# Patient Record
Sex: Female | Born: 1965 | Race: White | Hispanic: No | Marital: Married | Smoking: Former smoker
Health system: Southern US, Community
[De-identification: ages and names within clinical notes are randomized; demographics above are authoritative.]

## PROBLEM LIST (undated history)

## (undated) DIAGNOSIS — F329 Major depressive disorder, single episode, unspecified: Secondary | ICD-10-CM

## (undated) DIAGNOSIS — F32A Depression, unspecified: Secondary | ICD-10-CM

## (undated) DIAGNOSIS — E785 Hyperlipidemia, unspecified: Secondary | ICD-10-CM

## (undated) DIAGNOSIS — I10 Essential (primary) hypertension: Secondary | ICD-10-CM

## (undated) HISTORY — DX: Depression, unspecified: F32.A

## (undated) HISTORY — PX: VAGINAL HYSTERECTOMY: SUR661

## (undated) HISTORY — DX: Hyperlipidemia, unspecified: E78.5

## (undated) HISTORY — PX: IVC FILTER PLACEMENT (ARMC HX): HXRAD1551

## (undated) HISTORY — DX: Major depressive disorder, single episode, unspecified: F32.9

## (undated) HISTORY — DX: Essential (primary) hypertension: I10

---

## 2005-05-15 ENCOUNTER — Ambulatory Visit: Payer: Self-pay | Admitting: Internal Medicine

## 2005-05-24 ENCOUNTER — Ambulatory Visit: Payer: Self-pay | Admitting: Internal Medicine

## 2005-06-28 ENCOUNTER — Ambulatory Visit: Payer: Self-pay | Admitting: Urology

## 2005-07-12 ENCOUNTER — Ambulatory Visit: Payer: Self-pay | Admitting: Urology

## 2006-08-07 ENCOUNTER — Emergency Department: Payer: Self-pay

## 2007-01-22 ENCOUNTER — Ambulatory Visit: Payer: Self-pay | Admitting: Nurse Practitioner

## 2007-09-05 ENCOUNTER — Ambulatory Visit: Payer: Self-pay | Admitting: Internal Medicine

## 2007-11-03 ENCOUNTER — Emergency Department: Payer: Self-pay | Admitting: Emergency Medicine

## 2007-11-03 ENCOUNTER — Other Ambulatory Visit: Payer: Self-pay

## 2007-11-12 ENCOUNTER — Ambulatory Visit: Payer: Self-pay | Admitting: Family Medicine

## 2009-04-08 ENCOUNTER — Emergency Department: Payer: Self-pay | Admitting: Emergency Medicine

## 2009-08-25 ENCOUNTER — Ambulatory Visit: Payer: Self-pay | Admitting: Family Medicine

## 2009-12-07 ENCOUNTER — Ambulatory Visit: Payer: Self-pay | Admitting: Internal Medicine

## 2010-02-24 ENCOUNTER — Emergency Department: Payer: Self-pay | Admitting: Emergency Medicine

## 2010-03-02 ENCOUNTER — Ambulatory Visit: Payer: Self-pay | Admitting: Urology

## 2010-03-05 ENCOUNTER — Emergency Department: Payer: Self-pay | Admitting: Emergency Medicine

## 2010-03-09 ENCOUNTER — Ambulatory Visit: Payer: Self-pay | Admitting: Urology

## 2010-03-24 ENCOUNTER — Ambulatory Visit: Payer: Self-pay | Admitting: Urology

## 2010-04-03 ENCOUNTER — Ambulatory Visit: Payer: Self-pay | Admitting: Urology

## 2010-04-11 ENCOUNTER — Ambulatory Visit: Payer: Self-pay | Admitting: Urology

## 2012-05-07 ENCOUNTER — Ambulatory Visit: Payer: Self-pay | Admitting: Family Medicine

## 2012-05-07 LAB — URINALYSIS, COMPLETE
Bilirubin,UR: NEGATIVE
Leukocyte Esterase: NEGATIVE
Nitrite: NEGATIVE
Ph: 7.5 (ref 4.5–8.0)
Specific Gravity: 1.01 (ref 1.003–1.030)

## 2012-05-09 LAB — URINE CULTURE

## 2013-04-23 HISTORY — PX: OTHER SURGICAL HISTORY: SHX169

## 2013-05-26 ENCOUNTER — Ambulatory Visit: Payer: Self-pay | Admitting: Family Medicine

## 2013-06-04 ENCOUNTER — Ambulatory Visit: Payer: Self-pay | Admitting: Urology

## 2013-06-24 ENCOUNTER — Ambulatory Visit: Payer: Self-pay

## 2013-07-06 ENCOUNTER — Ambulatory Visit: Payer: Self-pay | Admitting: Urology

## 2013-07-06 LAB — BASIC METABOLIC PANEL
ANION GAP: 6 — AB (ref 7–16)
BUN: 13 mg/dL (ref 7–18)
Calcium, Total: 9.4 mg/dL (ref 8.5–10.1)
Chloride: 101 mmol/L (ref 98–107)
Co2: 26 mmol/L (ref 21–32)
Creatinine: 0.92 mg/dL (ref 0.60–1.30)
EGFR (African American): >60
EGFR (Non-African Amer.): >60
Glucose: 95 mg/dL (ref 65–99)
Osmolality: 266 (ref 275–301)
POTASSIUM: 3.4 mmol/L — AB (ref 3.5–5.1)
SODIUM: 133 mmol/L — AB (ref 136–145)

## 2013-07-09 ENCOUNTER — Ambulatory Visit: Payer: Self-pay | Admitting: Urology

## 2013-07-16 ENCOUNTER — Ambulatory Visit: Payer: Self-pay | Admitting: Urology

## 2013-09-29 ENCOUNTER — Ambulatory Visit: Payer: Self-pay | Admitting: Urology

## 2013-09-30 ENCOUNTER — Ambulatory Visit: Payer: Self-pay | Admitting: Urology

## 2013-09-30 LAB — POTASSIUM: Potassium: 3.5 mmol/L (ref 3.5–5.1)

## 2013-10-01 ENCOUNTER — Ambulatory Visit: Payer: Self-pay | Admitting: Urology

## 2014-01-19 ENCOUNTER — Ambulatory Visit: Payer: Self-pay | Admitting: Family Medicine

## 2014-01-19 ENCOUNTER — Emergency Department: Payer: Self-pay | Admitting: Emergency Medicine

## 2014-01-19 LAB — CBC
HCT: 45.7 % (ref 35.0–47.0)
HGB: 15.1 g/dL (ref 12.0–16.0)
MCH: 29.7 pg (ref 26.0–34.0)
MCHC: 33 g/dL (ref 32.0–36.0)
MCV: 90 fL (ref 80–100)
Platelet: 350 10*3/uL (ref 150–440)
RBC: 5.09 10*6/uL (ref 3.80–5.20)
RDW: 13.3 % (ref 11.5–14.5)
WBC: 9.2 10*3/uL (ref 3.6–11.0)

## 2014-01-19 LAB — URINALYSIS, COMPLETE
BILIRUBIN, UR: NEGATIVE
Blood: NEGATIVE
Glucose,UR: NEGATIVE
Ketone: NEGATIVE
Nitrite: NEGATIVE
PH: 6 (ref 5.0–8.0)
Protein: 30
RBC,UR: NONE SEEN /HPF (ref 0–5)
Specific Gravity: 1.02 (ref 1.000–1.030)
WBC UR: >30 /HPF (ref 0–5)

## 2014-01-19 LAB — COMPREHENSIVE METABOLIC PANEL
Albumin: 3.8 g/dL (ref 3.4–5.0)
Alkaline Phosphatase: 36 U/L — ABNORMAL LOW
Anion Gap: 7 (ref 7–16)
BILIRUBIN TOTAL: 0.6 mg/dL (ref 0.2–1.0)
BUN: 8 mg/dL (ref 7–18)
CALCIUM: 9.5 mg/dL (ref 8.5–10.1)
Chloride: 106 mmol/L (ref 98–107)
Co2: 25 mmol/L (ref 21–32)
Creatinine: 0.99 mg/dL (ref 0.60–1.30)
GLUCOSE: 113 mg/dL — AB (ref 65–99)
OSMOLALITY: 275 (ref 275–301)
Potassium: 3.4 mmol/L — ABNORMAL LOW (ref 3.5–5.1)
SGOT(AST): 26 U/L (ref 15–37)
SGPT (ALT): 41 U/L
Sodium: 138 mmol/L (ref 136–145)
Total Protein: 7.5 g/dL (ref 6.4–8.2)

## 2014-01-19 LAB — LIPASE, BLOOD: Lipase: 134 U/L (ref 73–393)

## 2014-01-19 LAB — TROPONIN I

## 2014-01-21 HISTORY — PX: CHOLECYSTECTOMY: SHX55

## 2014-01-28 ENCOUNTER — Ambulatory Visit: Payer: Self-pay | Admitting: Surgery

## 2014-02-01 LAB — PATHOLOGY REPORT

## 2014-07-15 ENCOUNTER — Ambulatory Visit: Payer: Self-pay

## 2014-07-27 ENCOUNTER — Ambulatory Visit
Admit: 2014-07-27 | Disposition: A | Discharge status: Home / Self Care | Payer: Self-pay | Attending: Urology | Admitting: Urology

## 2014-08-04 ENCOUNTER — Ambulatory Visit
Admit: 2014-08-04 | Disposition: A | Discharge status: Home / Self Care | Payer: Self-pay | Attending: Family Medicine | Admitting: Family Medicine

## 2014-08-14 NOTE — Op Note (Signed)
PATIENT NAME:  Jenny Hall, Jenny Hall MR#:  [id number] DATE OF BIRTH:  1965-10-16  DATE OF PROCEDURE:  01/28/2014  PREOPERATIVE DIAGNOSIS: Symptomatic cholelithiasis.   POSTOPERATIVE DIAGNOSIS: Symptomatic cholelithiasis.   PROCEDURE PERFORMED: Laparoscopic cholecystectomy.   ANESTHESIA: General.   ESTIMATED BLOOD LOSS: 15 mL.  COMPLICATIONS: None.   SPECIMENS:  Gallbladder.  INDICATIONS: Ms. Jenny Hall is a pleasant 49 year old female who presents with approximately 3 days of worsening right upper quadrant pain. She had an ultrasound and labs with pain characteristics consistent with symptomatic cholelithiasis. She was brought to the operating room for laparoscopic cholecystectomy.   DETAILS OF PROCEDURE: Informed consent was obtained.  Ms. Jenny Hall was brought to the operating room suite. She was induced. Endotracheal tube was placed, general anesthesia was administered. Her abdomen was prepped and draped in a standard surgical fashion. A timeout was then performed correctly identifying the patient name, operative site and procedure to be performed. A supraumbilical incision was made and was deepened down to the fascia. The fascia was incised. The peritoneum was entered. Two stay sutures were placed through the fasciotomy. A Hassan trocar was placed through the fasciotomy. The abdomen was insufflated. An 11 mm epigastric and two 5 mm right subcostal trocars were placed. The gallbladder was then lifted up over the dome of the liver. The cystic artery and cystic duct were dissected out. The critical view was obtained. The cystic duct and cystic artery were clipped 3 times each and ligated. The gallbladder was then taken off the gallbladder fossa with hook electrocautery and brought out through an Endo Catch bag. The gallbladder fossa was then examined for hemostasis and irrigated after all fluid was cleared and aspirated. Hemostasis was obtained. Decision was made to desufflate the abdomen and remove all  trocars under direct visualization. The supraumbilical fascia was closed with a figure-of-eight 0 Vicryl. All port sites were infiltrated with 1% lidocaine with epinephrine and the skin was closed with interrupted 4-0 Monocryl deep dermal sutures. Steri-Strips, Telfa gauze and Tegaderm were used to complete the dressing. The patient was then awoken, extubated and brought to the postanesthesia care unit. There were no immediate complications. Needle, sponge, and instrument count was correct at the end of the procedure.    ____________________________ Si Raiderhristopher A. Kea Callan, MD cal:hh D: 01/29/2014 19:22:54 ET T: 01/29/2014 20:18:01 ET JOB#: [id number]  cc: Cristal Deerhristopher A. Diara Chaudhari, MD, <Dictator> Jarvis NewcomerHRISTOPHER A Lenwood Balsam MD ELECTRONICALLY SIGNED 02/08/2014 7:09

## 2014-08-14 NOTE — Op Note (Signed)
PATIENT NAME:  Jenny Hall, Jenny Hall MR#:  [id number] DATE OF BIRTH:  April 30, 1965  DATE OF PROCEDURE:  10/01/2013  PREOPERATIVE DIAGNOSIS: Distal left ureteral calculus.   POSTOPERATIVE DIAGNOSIS: Distal left ureteral calculus.   PROCEDURE: Cystoscopy, left ureteroscopy, laser lithotripsy and extraction of calculus with a basket.   ANESTHESIA: General.  SPECIMEN: No stent, but calculi fragments removed.   FINDINGS: A distal left ureteral calculus with obstruction.   SURGEON: Trey Paulaichard Hart, MD   COMPLICATIONS: None.   BLOOD LOSS: 0 mL.  DESCRIPTION OF PROCEDURE: After an appropriate timeout with the patient sterilely prepped and draped,  we began the procedure. Fluoroscopy set so that we can easily see the left side of the abdomen and pelvis. An obvious calculus is seen in the distal left ureter. It had not been broken up with attempted shockwave lithotripsy when it was at the [facility]. There was a good amount of hydro behind this calculus. An 0.036 Glidewire was placed easily past the calculus through the cystoscope. Once past this, I take a 7-French telescoping ureteroscope into the ureter. The calculus seen is a rounded calculus mulberry-shaped. With a 365 micron front firing holmium laser, I disintegrate the calculus at a low power. The fragments are then removed with a 4 wire Nitinol 0 tipped basket without difficulty. They will not be sent for analysis. Then once this is done, I see that there is very little edema in the ureter. There is no perforation of the ureter by the holmium laser, so I am able to not put a stent in. The patient's bladder is emptied and 30 mL of 0.5% Marcaine are placed in the bladder. The original cystoscopy of the bladder revealed no tumors, masses or growths within the bladder and both orifices are intact. Then a B and O suppository is placed in the rectum. Bimanual exam reveals no rectal tumors, masses or growths.    ____________________________ Caralyn Guileichard D. Edwyna ShellHart,  DO rdh:aw D: 10/01/2013 12:35:05 ET T: 10/01/2013 12:45:31 ET JOB#: [id number]  cc: Caralyn Guileichard D. Edwyna ShellHart, DO, <Dictator> RICHARD D HART DO ELECTRONICALLY SIGNED 10/02/2013 15:42

## 2014-08-19 DIAGNOSIS — S52509A Unspecified fracture of the lower end of unspecified radius, initial encounter for closed fracture: Secondary | ICD-10-CM | POA: Insufficient documentation

## 2014-08-22 HISTORY — PX: WRIST FRACTURE SURGERY: SHX121

## 2015-01-27 ENCOUNTER — Encounter: Payer: Self-pay | Admitting: Family Medicine

## 2015-01-27 ENCOUNTER — Ambulatory Visit (INDEPENDENT_AMBULATORY_CARE_PROVIDER_SITE_OTHER): Payer: BLUE CROSS/BLUE SHIELD | Admitting: Family Medicine

## 2015-01-27 VITALS — BP 120/82 | HR 68 | Ht 69.0 in | Wt 202.0 lb

## 2015-01-27 DIAGNOSIS — I10 Essential (primary) hypertension: Secondary | ICD-10-CM | POA: Diagnosis not present

## 2015-01-27 DIAGNOSIS — E785 Hyperlipidemia, unspecified: Secondary | ICD-10-CM | POA: Diagnosis not present

## 2015-01-27 DIAGNOSIS — F3341 Major depressive disorder, recurrent, in partial remission: Secondary | ICD-10-CM

## 2015-01-27 MED ORDER — FENOFIBRIC ACID 135 MG PO CPDR: 1.0000 | DELAYED_RELEASE_CAPSULE | Freq: Every day | ORAL | Status: AC

## 2015-01-27 MED ORDER — LISINOPRIL 20 MG PO TABS: 20.0000 mg | ORAL_TABLET | Freq: Every day | ORAL | Status: DC

## 2015-01-27 MED ORDER — HYDROCHLOROTHIAZIDE 25 MG PO TABS: 25.0000 mg | ORAL_TABLET | Freq: Every day | ORAL | Status: AC

## 2015-01-27 MED ORDER — METOPROLOL SUCCINATE ER 100 MG PO TB24: 100.0000 mg | ORAL_TABLET | Freq: Every day | ORAL | Status: DC

## 2015-01-27 MED ORDER — VENLAFAXINE HCL ER 150 MG PO CP24: 150.0000 mg | ORAL_CAPSULE | Freq: Every day | ORAL | Status: DC

## 2015-01-27 NOTE — Progress Notes (Signed)
Name: Jenny Hall   MRN: [id number]    DOB: 1966-03-28   Date:01/27/2015       Progress Note  Subjective  Chief Complaint  Chief Complaint  Patient presents with  . Hypertension  . Hyperlipidemia  . Depression    Hypertension This is a chronic problem. The current episode started more than 1 year ago. The problem has been gradually improving since onset. The problem is controlled. Pertinent negatives include no anxiety, blurred vision, chest pain, headaches, malaise/fatigue, neck pain, orthopnea, palpitations, peripheral edema, PND, shortness of breath or sweats. There are no associated agents to hypertension. Risk factors for coronary artery disease include obesity, dyslipidemia, smoking/tobacco exposure and post-menopausal state. Past treatments include ACE inhibitors, beta blockers and diuretics. The current treatment provides moderate improvement. There are no compliance problems.  There is no history of angina, kidney disease, CAD/MI, CVA, heart failure, left ventricular hypertrophy, PVD, renovascular disease or retinopathy. There is no history of chronic renal disease.  Hyperlipidemia This is a chronic problem. The current episode started more than 1 year ago. The problem is controlled. Recent lipid tests were reviewed and are low. Exacerbating diseases include obesity. She has no history of chronic renal disease, diabetes, hypothyroidism, liver disease or nephrotic syndrome. Factors aggravating her hyperlipidemia include beta blockers. Pertinent negatives include no chest pain, focal sensory loss, focal weakness, leg pain, myalgias or shortness of breath. Current antihyperlipidemic treatment includes fibric acid derivatives. The current treatment provides mild improvement of lipids. There are no compliance problems.   Depression        This is a chronic problem.  The current episode started more than 1 year ago.   The onset quality is gradual.   The problem has been gradually improving  since onset.  Associated symptoms include no decreased concentration, no fatigue, no hopelessness, does not have insomnia, no myalgias, no headaches and no suicidal ideas.     The symptoms are aggravated by nothing.  Past treatments include SSRIs - Selective serotonin reuptake inhibitors.  Compliance with treatment is good.   Pertinent negatives include no hypothyroidism and no anxiety.   No problem-specific assessment & plan notes found for this encounter.   Past Medical History  Diagnosis Date  . Depression   . Hyperlipidemia   . Hypertension     Past Surgical History  Procedure Laterality Date  . Vaginal hysterectomy    . Ivc filter placement ([facility] hx)    . Lithotrpsy  2015  . Cholecystectomy  01/2014  . Wrist fracture surgery Left 08/2014    Family History  Problem Relation Age of Onset  . Heart disease Father     Social History   Social History  . Marital Status: Married    Spouse Name: N/A  . Number of Children: N/A  . Years of Education: N/A   Occupational History  . Not on file.   Social History Main Topics  . Smoking status: Former Smoker    Quit date: 04/23/2014  . Smokeless tobacco: Not on file  . Alcohol Use: No  . Drug Use: No  . Sexual Activity: Not Currently   Other Topics Concern  . Not on file   Social History Narrative  . No narrative on file    No Known Allergies   Review of Systems  Constitutional: Negative for fever, chills, weight loss, malaise/fatigue and fatigue.  HENT: Negative for ear discharge, ear pain and sore throat.   Eyes: Negative for blurred vision.  Respiratory: Negative for  cough, sputum production, shortness of breath and wheezing.   Cardiovascular: Negative for chest pain, palpitations, orthopnea, leg swelling and PND.  Gastrointestinal: Negative for heartburn, nausea, abdominal pain, diarrhea, constipation, blood in stool and melena.  Genitourinary: Negative for dysuria, urgency, frequency and hematuria.   Musculoskeletal: Negative for myalgias, back pain, joint pain and neck pain.  Skin: Negative for rash.  Neurological: Negative for dizziness, tingling, sensory change, focal weakness and headaches.  Endo/Heme/Allergies: Negative for environmental allergies and polydipsia. Does not bruise/bleed easily.  Psychiatric/Behavioral: Positive for depression. Negative for suicidal ideas and decreased concentration. The patient is not nervous/anxious and does not have insomnia.      Objective  Filed Vitals:   01/27/15 1029  BP: 120/82  Pulse: 68  Height:  (1.753 m)  Weight: 202 lb (91.627 kg)    Physical Exam  Constitutional: She is well-developed, well-nourished, and in no distress. No distress.  HENT:  Head: Normocephalic and atraumatic.  Right Ear: External ear normal.  Left Ear: External ear normal.  Nose: Nose normal.  Mouth/Throat: Oropharynx is clear and moist.  Eyes: Conjunctivae and EOM are normal. Pupils are equal, round, and reactive to light. Right eye exhibits no discharge. Left eye exhibits no discharge.  Neck: Normal range of motion. Neck supple. No JVD present. No thyromegaly present.  Cardiovascular: Normal rate, regular rhythm, normal heart sounds and intact distal pulses.  Exam reveals no gallop and no friction rub.   No murmur heard. Pulmonary/Chest: Effort normal and breath sounds normal.  Abdominal: Soft. Bowel sounds are normal. She exhibits no mass. There is no tenderness. There is no guarding.  Musculoskeletal: Normal range of motion. She exhibits no edema.  Lymphadenopathy:    She has no cervical adenopathy.  Neurological: She is alert. She has normal reflexes.  Skin: Skin is warm and dry. She is not diaphoretic.  Psychiatric: Mood and affect normal.      Assessment & Plan  Problem List Items Addressed This Visit      Cardiovascular and Mediastinum   Essential hypertension - Primary   Relevant Medications   Choline Fenofibrate (FENOFIBRIC ACID)  135 MG CPDR   hydrochlorothiazide (HYDRODIURIL) 25 MG tablet   lisinopril (PRINIVIL,ZESTRIL) 20 MG tablet   metoprolol succinate (TOPROL-XL) 100 MG 24 hr tablet   Other Relevant Orders   Renal Function Panel     Other   Hyperlipidemia   Relevant Medications   Choline Fenofibrate (FENOFIBRIC ACID) 135 MG CPDR   hydrochlorothiazide (HYDRODIURIL) 25 MG tablet   lisinopril (PRINIVIL,ZESTRIL) 20 MG tablet   metoprolol succinate (TOPROL-XL) 100 MG 24 hr tablet   Other Relevant Orders   Lipid Profile   Recurrent major depressive disorder, in partial remission (HCC)   Relevant Medications   venlafaxine XR (EFFEXOR-XR) 150 MG 24 hr capsule        Dr. [facility]  01/27/2015

## 2015-01-27 NOTE — Patient Instructions (Signed)

## 2015-01-28 LAB — RENAL FUNCTION PANEL
Albumin: 4.3 g/dL (ref 3.5–5.5)
BUN / CREAT RATIO: 13 (ref 9–23)
BUN: 11 mg/dL (ref 6–24)
CALCIUM: 10.4 mg/dL — AB (ref 8.7–10.2)
CHLORIDE: 98 mmol/L (ref 97–108)
CO2: 24 mmol/L (ref 18–29)
Creatinine, Ser: 0.83 mg/dL (ref 0.57–1.00)
GFR calc non Af Amer: 82 mL/min/{1.73_m2} (ref >59)
GFR, EST AFRICAN AMERICAN: 95 mL/min/{1.73_m2} (ref >59)
GLUCOSE: 92 mg/dL (ref 65–99)
POTASSIUM: 4.1 mmol/L (ref 3.5–5.2)
Phosphorus: 3.2 mg/dL (ref 2.5–4.5)
SODIUM: 137 mmol/L (ref 134–144)

## 2015-01-28 LAB — LIPID PANEL
CHOLESTEROL TOTAL: 176 mg/dL (ref 100–199)
Chol/HDL Ratio: 4.8 ratio units — ABNORMAL HIGH (ref 0.0–4.4)
HDL: 37 mg/dL — ABNORMAL LOW (ref >39)
LDL Calculated: 113 mg/dL — ABNORMAL HIGH (ref 0–99)
Triglycerides: 128 mg/dL (ref 0–149)
VLDL CHOLESTEROL CAL: 26 mg/dL (ref 5–40)

## 2015-05-16 ENCOUNTER — Other Ambulatory Visit: Payer: Self-pay

## 2015-08-02 ENCOUNTER — Other Ambulatory Visit: Payer: Self-pay

## 2015-08-02 DIAGNOSIS — F3341 Major depressive disorder, recurrent, in partial remission: Secondary | ICD-10-CM

## 2015-08-02 DIAGNOSIS — I10 Essential (primary) hypertension: Secondary | ICD-10-CM

## 2015-08-02 MED ORDER — LISINOPRIL 20 MG PO TABS: 20.0000 mg | ORAL_TABLET | Freq: Every day | ORAL | Status: AC

## 2015-08-02 MED ORDER — VENLAFAXINE HCL ER 150 MG PO CP24: 150.0000 mg | ORAL_CAPSULE | Freq: Every day | ORAL | Status: AC

## 2015-08-02 MED ORDER — METOPROLOL SUCCINATE ER 100 MG PO TB24: 100.0000 mg | ORAL_TABLET | Freq: Every day | ORAL | Status: AC

## 2016-10-22 IMAGING — CT CT STONE STUDY
2 of 4 series · 16 of 46 positions shown, 18 images · non-contrast
Comparison: Noncontrast CT on 05/26/2013

CLINICAL DATA: Right flank pain radiating to right groin for 2
weeks. Microscopic hematuria. Nephrolithiasis.

EXAM:
CT ABDOMEN AND PELVIS WITHOUT CONTRAST
TECHNIQUE: Multidetector CT imaging of the abdomen and pelvis was performed
following the standard protocol without IV contrast.

[Series 2: stone · axial · 0.82mm/px · z∈[-1059,-599]mm · 13 of 100 slices shown, 15 images]
[im 4/100  soft-tissue]
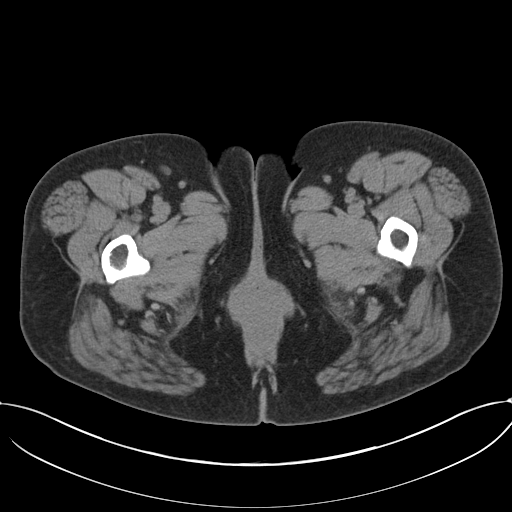
[im 4/100  bone]
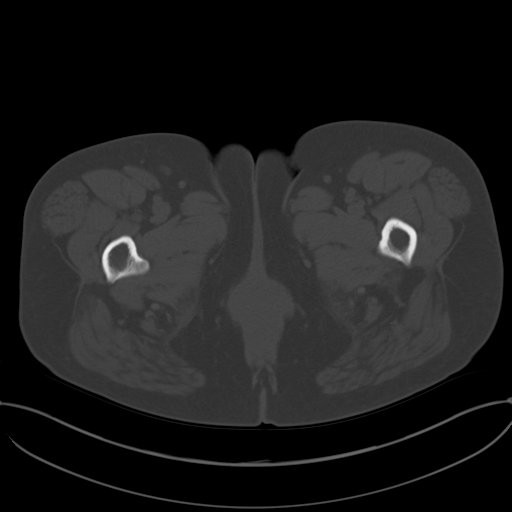
[im 12/100  soft-tissue]
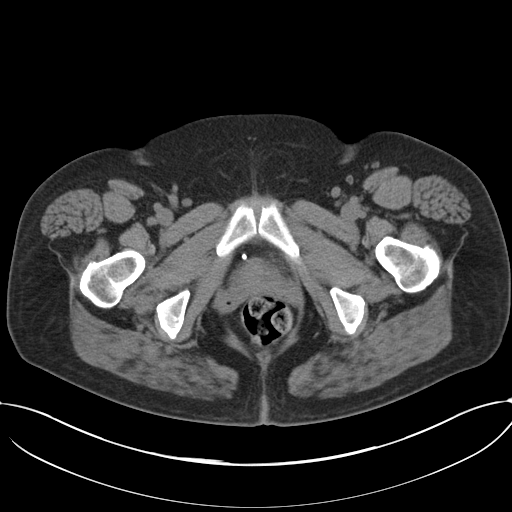
[im 20/100  soft-tissue]
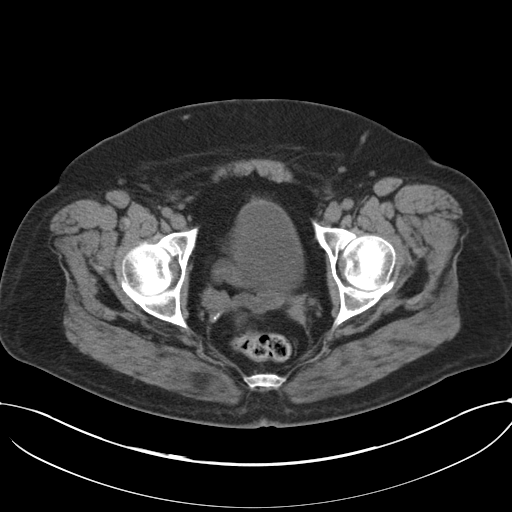
[im 28/100  soft-tissue]
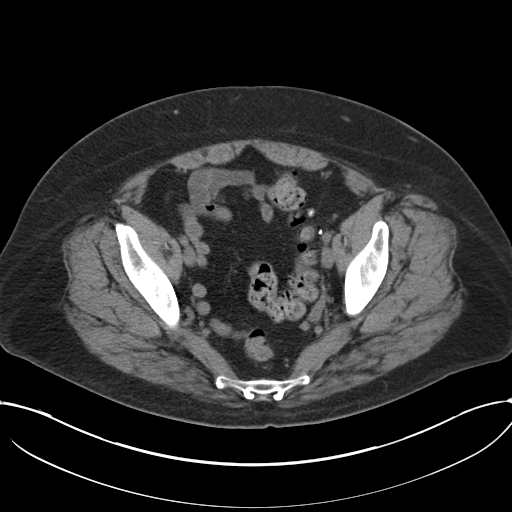
[im 36/100  soft-tissue]
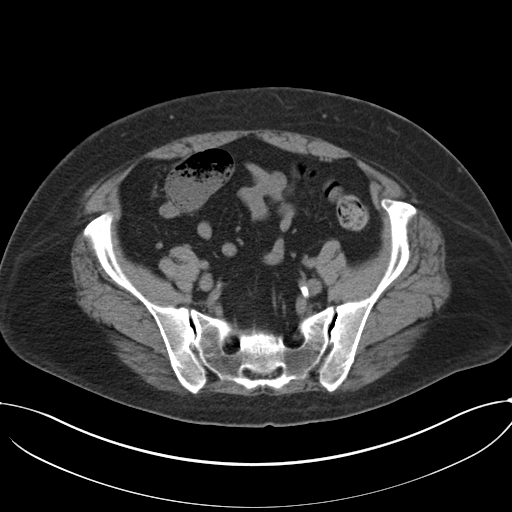
[im 44/100  soft-tissue]
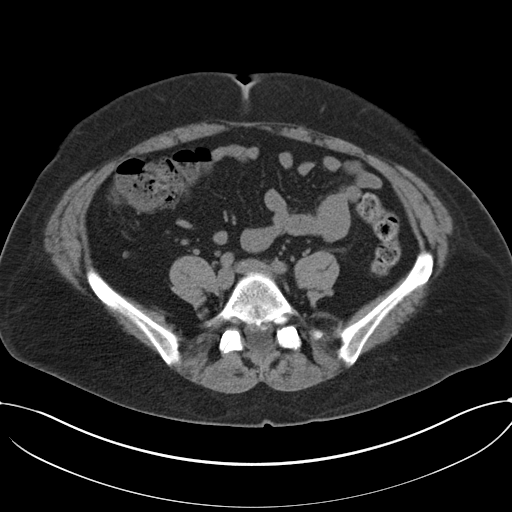
[im 52/100  soft-tissue]
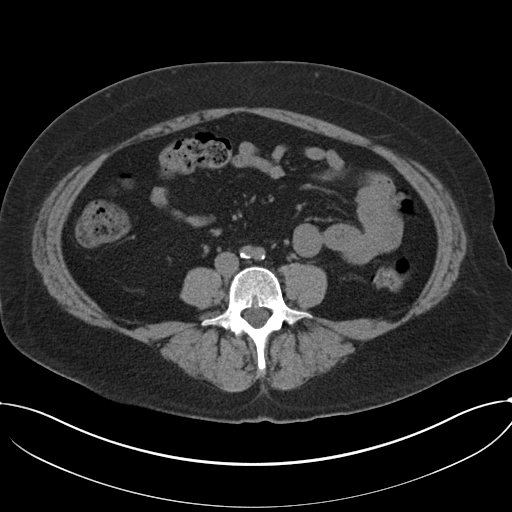
[im 56/100  soft-tissue]
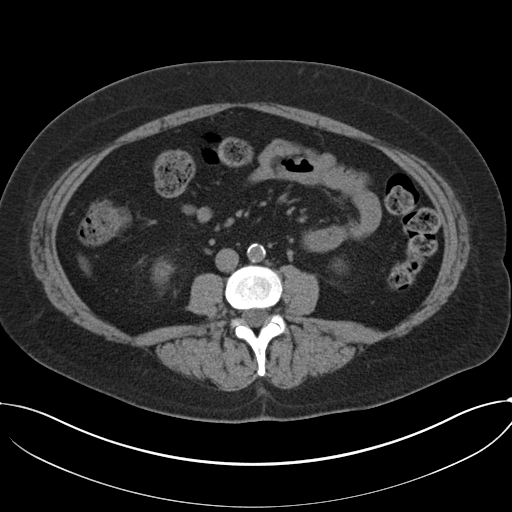
[im 64/100  soft-tissue]
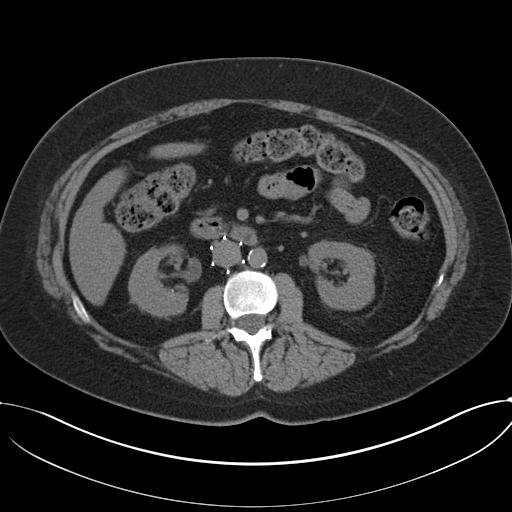
[im 64/100  bone]
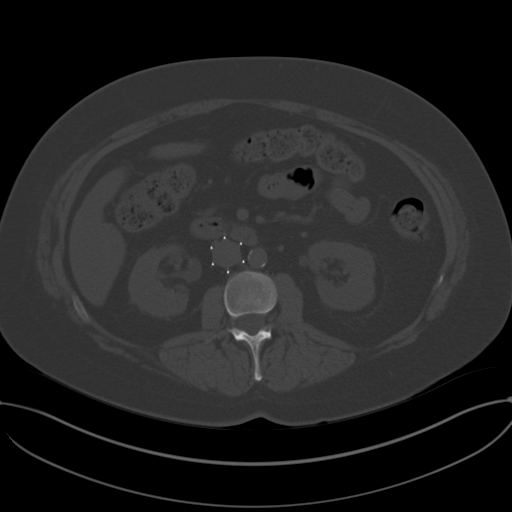
[im 72/100  soft-tissue]
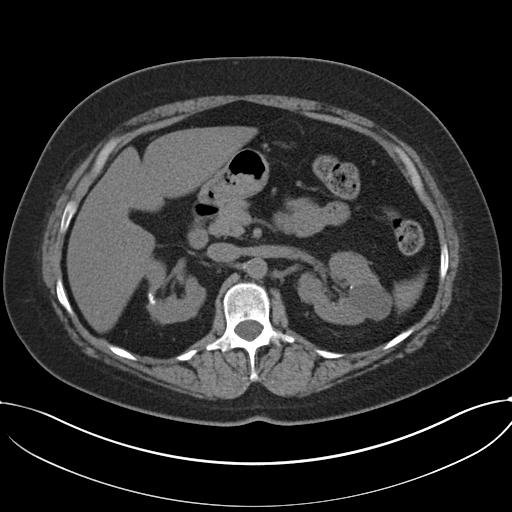
[im 80/100  soft-tissue]
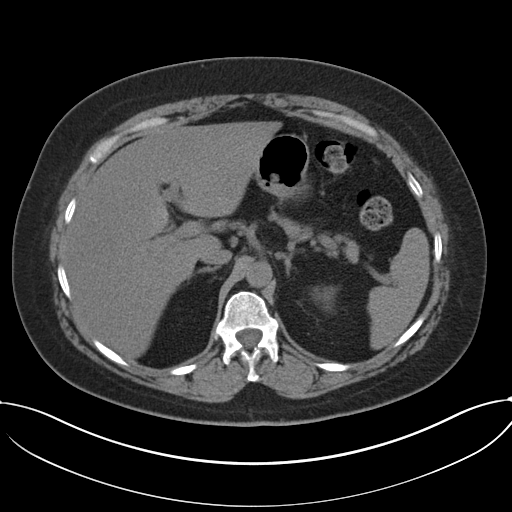
[im 88/100  soft-tissue]
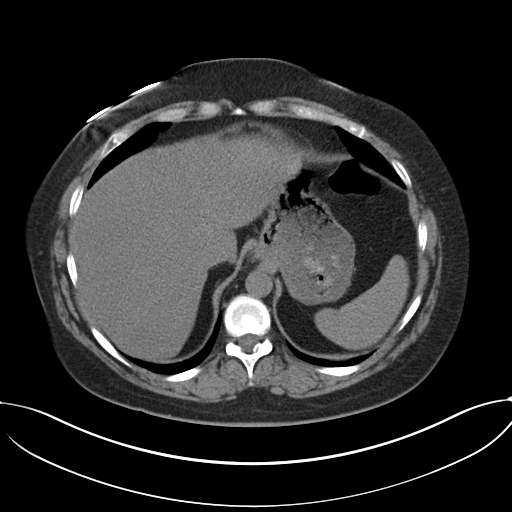
[im 96/100  soft-tissue]
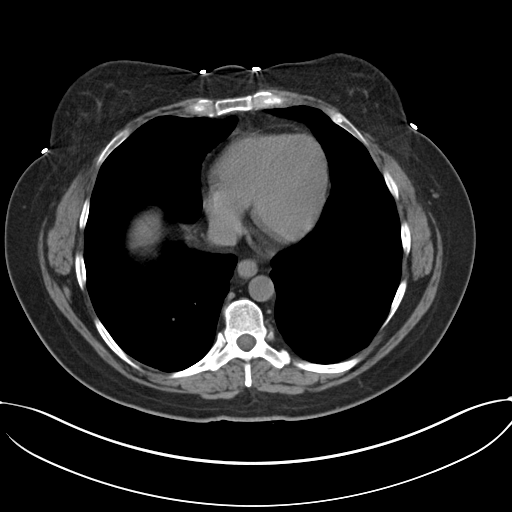

[Series 5: cor · coronal · 0.85mm/px · 3 of 157 slices shown]
[im 53/157  soft-tissue]
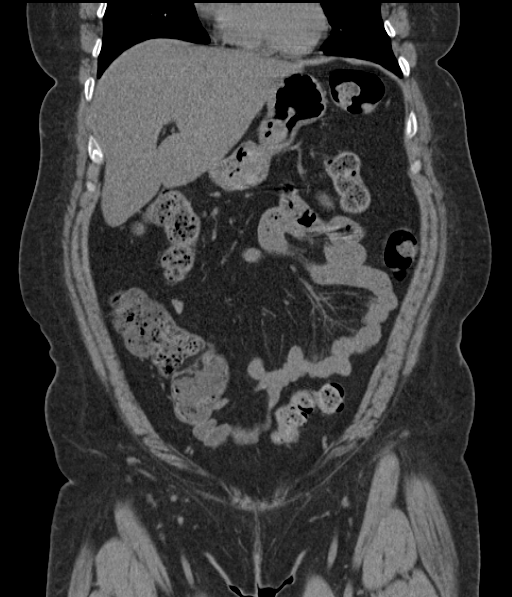
[im 70/157  soft-tissue]
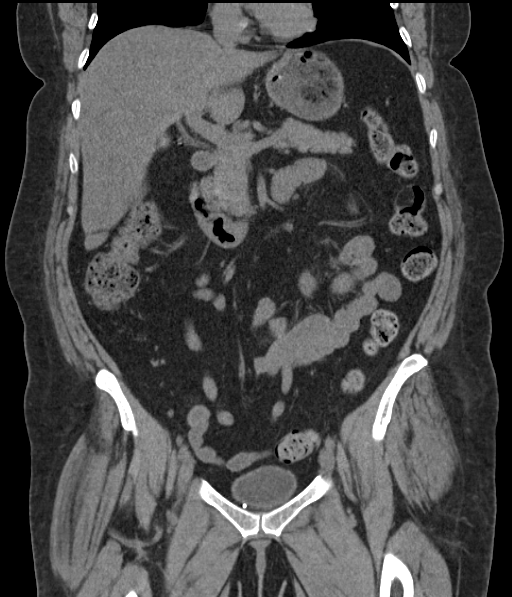
[im 87/157  soft-tissue]
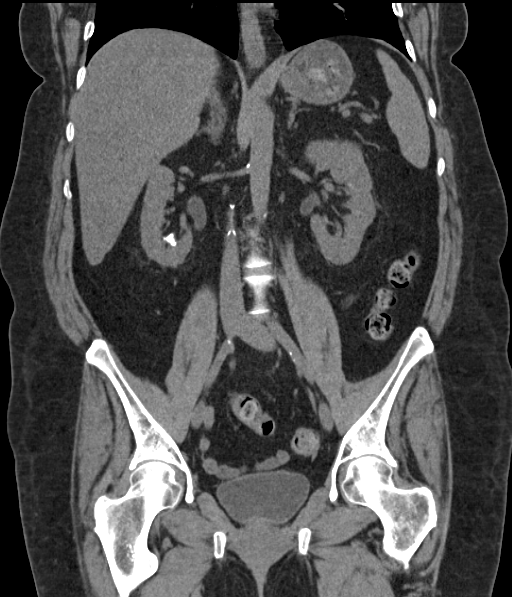

[16 of 46 positions shown; findings below may reference images not displayed]

FINDINGS: Lower chest:  Unremarkable.

Hepatobiliary: No mass visualized on this unenhanced exam. Diffuse
hepatic steatosis noted. Prior cholecystectomy noted. No evidence of
biliary dilatation.

Pancreas: No mass or inflammatory process visualized on this
unenhanced exam.

Spleen:  Within normal limits in size.

Adrenal Glands:  No masses identified.

Kidneys/Urinary tract: Small less than 1 cm intrarenal calculi are
again seen bilaterally. Right renal parenchymal scarring again noted
as well as fluid attenuation cyst in the midpole the left kidney.
There is no evidence of hydronephrosis. No evidence ureteral calculi
or dilatation. No bladder calculi identified.

Stomach/Bowel/Peritoneum: Unremarkable. Normal appendix visualized.

Vascular/Lymphatic: No pathologically enlarged lymph nodes
identified. IVC filter remains in appropriate position.

Reproductive: Prior hysterectomy noted. A cystic lesion in the left
adnexa has decreased in size, currently measuring 2.8 cm compared to
4.2 cm previously. This consistent with a benign etiology, most
likely a functional ovarian cyst in a reproductive age female.

Other:  None.

Musculoskeletal:  No suspicious bone lesions identified.
IMPRESSION: Bilateral nonobstructive nephrolithiasis. No evidence of ureteral
calculi, hydronephrosis, or other acute findings.

Hepatic steatosis.

Decreased size of left ovarian cyst since previous study, consistent
with benign etiology.
